# Patient Record
Sex: Male | Born: 1980 | Race: Black or African American | Hispanic: No | Marital: Single | Smoking: Current every day smoker
Health system: Southern US, Community
[De-identification: ages and names within clinical notes are randomized; demographics above are authoritative.]

## PROBLEM LIST (undated history)

## (undated) HISTORY — PX: ANKLE SURGERY: SHX546

---

## 2016-06-13 ENCOUNTER — Emergency Department
Admission: EM | Admit: 2016-06-13 | Discharge: 2016-06-13 | Disposition: A | Discharge status: Home / Self Care | Payer: Self-pay | Attending: Emergency Medicine | Admitting: Emergency Medicine

## 2016-06-13 ENCOUNTER — Encounter: Payer: Self-pay | Admitting: Emergency Medicine

## 2016-06-13 DIAGNOSIS — F17200 Nicotine dependence, unspecified, uncomplicated: Secondary | ICD-10-CM | POA: Insufficient documentation

## 2016-06-13 DIAGNOSIS — Z5321 Procedure and treatment not carried out due to patient leaving prior to being seen by health care provider: Secondary | ICD-10-CM | POA: Insufficient documentation

## 2016-06-13 DIAGNOSIS — K0889 Other specified disorders of teeth and supporting structures: Secondary | ICD-10-CM | POA: Insufficient documentation

## 2016-06-13 NOTE — ED Triage Notes (Signed)
Pt presents to ED c/o worsening tooth pain x1 week . Pt has taken excedrin , orajel , salt and water gurgles without relief

## 2020-09-11 ENCOUNTER — Emergency Department: Payer: Self-pay

## 2020-09-11 ENCOUNTER — Emergency Department
Admission: EM | Admit: 2020-09-11 | Discharge: 2020-09-11 | Disposition: A | Discharge status: Home / Self Care | Payer: Self-pay | Attending: Emergency Medicine | Admitting: Emergency Medicine

## 2020-09-11 ENCOUNTER — Other Ambulatory Visit: Payer: Self-pay

## 2020-09-11 DIAGNOSIS — F17210 Nicotine dependence, cigarettes, uncomplicated: Secondary | ICD-10-CM | POA: Insufficient documentation

## 2020-09-11 DIAGNOSIS — M5416 Radiculopathy, lumbar region: Secondary | ICD-10-CM | POA: Insufficient documentation

## 2020-09-11 MED ORDER — PREDNISONE 10 MG (21) PO TBPK: ORAL_TABLET | ORAL | 0 refills | Status: AC

## 2020-09-11 MED ORDER — BACLOFEN 10 MG PO TABS: 10.0000 mg | ORAL_TABLET | Freq: Three times a day (TID) | ORAL | 0 refills | Status: AC

## 2020-09-11 NOTE — Discharge Instructions (Addendum)
Clarence Hunt, chiropractor, [address] [place], [place], [place] [address] Phone: [phone] Dr Rosita Kea is general orthopedics, can order MRI if needed  Take the medication as prescribed

## 2020-09-11 NOTE — ED Provider Notes (Signed)
[facility] Emergency Department Provider Note  ____________________________________________   Event Date/Time   First MD Initiated Contact with Patient 09/11/20 [phone]     (approximate)  I have reviewed the triage vital signs and the nursing notes.   HISTORY  Chief Complaint Back Pain    HPI Clarence Hunt is a 40 y.o. male presents emergency department complaining of low back pain for approximately 6 weeks.  Patient states he was taken over-the-counter ibuprofen/Tylenol without any relief.  Patient works for a [employer] heavy objects on a regular basis.  States the pain is located in the lower back and radiates into the back of the thigh.  Denies numbness, tingling, loss of bowel or bladder control  History reviewed. No pertinent past medical history.  There are no problems to display for this patient.   Past Surgical History:  Procedure Laterality Date   ANKLE SURGERY      Prior to Admission medications   Medication Sig Start Date End Date Taking? Authorizing Provider  baclofen (LIORESAL) 10 MG tablet Take 1 tablet (10 mg total) by mouth 3 (three) times daily for 7 days. 09/11/20 09/18/20 Yes Beaulah Romanek, Roselyn Bering, PA-C  predniSONE (STERAPRED UNI-PAK 21 TAB) 10 MG (21) TBPK tablet Take 6 pills on day one then decrease by 1 pill each day 09/11/20  Yes Faythe Ghee, PA-C    Allergies Patient has no known allergies.  No family history on file.  Social History Social History   Tobacco Use   Smoking status: Every Day    Packs/day: 1.00    Types: Cigarettes  Substance Use Topics   Alcohol use: Yes    Review of Systems  Constitutional: No fever/chills Eyes: No visual changes. ENT: No sore throat. Respiratory: Denies cough Cardiovascular: Denies chest pain Gastrointestinal: Denies abdominal pain Genitourinary: Negative for dysuria. Musculoskeletal: Positive for back pain. Skin: Negative for rash. Psychiatric: no mood  changes,     ____________________________________________   PHYSICAL EXAM:  VITAL SIGNS: ED Triage Vitals  Enc Vitals Group     BP 09/11/20 0628 (!) 183/105     Pulse Rate 09/11/20 0628 62     Resp 09/11/20 0628 18     Temp 09/11/20 0628 98.3 F (36.8 C)     Temp Source 09/11/20 0628 Oral     SpO2 09/11/20 0628 100 %     Weight 09/11/20 0630 230 lb (104.3 kg)     Height 09/11/20 0630 5\' 11"  (1.803 m)     Head Circumference --      Peak Flow --      Pain Score 09/11/20 0633 1     Pain Loc --      Pain Edu? --      Excl. in GC? --     Constitutional: Alert and oriented. Well appearing and in no acute distress. Eyes: Conjunctivae are normal.  Head: Atraumatic. Nose: No congestion/rhinnorhea. Mouth/Throat: Mucous membranes are moist.   Neck:  supple no lymphadenopathy noted Cardiovascular: Normal rate, regular rhythm. Heart sounds are normal Respiratory: Normal respiratory effort.  No retractions, lungs c t a  GU: deferred Musculoskeletal: FROM all extremities, warm and well perfused, lower back is tender along the paravertebral muscles and into the SI joint, pain is reproduced with deep palpation along the right buttock, patient has decreased range of motion with leaning forwards, neurovascular is intact, he is able to walk on his toes and reveals Neurologic:  Normal speech and language.  Skin:  Skin is warm, dry and intact. No rash noted. Psychiatric: Mood and affect are normal. Speech and behavior are normal.  ____________________________________________   LABS (all labs ordered are listed, but only abnormal results are displayed)  Labs Reviewed - No data to display ____________________________________________   ____________________________________________  RADIOLOGY  X-ray lumbar spine  ____________________________________________   PROCEDURES  Procedure(s) performed: No  Procedures    ____________________________________________   INITIAL  IMPRESSION / ASSESSMENT AND PLAN / ED COURSE  Pertinent labs & imaging results that were available during my care of the patient were reviewed by me and considered in my medical decision making (see chart for details).   The patient is 40 year old male presents with low back pains.  See HPI.  Physical exam shows patient appears stable  X-ray lumbar spine  Lumbar spine reviewed by me and confirmed by radiology to have degenerative changes  Explained the findings to the patient, placed him on sterapred and baclofen.  Follow up with a chiropractor or orthopedics if not improving in 1 week, given back exercises and proper lifting instructions, discharged in stable condition     Clarence Hunt was evaluated in Emergency Department on 09/11/2020 for the symptoms described in the history of present illness. He was evaluated in the context of the global COVID-19 pandemic, which necessitated consideration that the patient might be at risk for infection with the SARS-CoV-2 virus that causes COVID-19. Institutional protocols and algorithms that pertain to the evaluation of patients at risk for COVID-19 are in a state of rapid change based on information released by regulatory bodies including the CDC and federal and state organizations. These policies and algorithms were followed during the patient's care in the ED.    As part of my medical decision making, I reviewed the following data within the electronic MEDICAL RECORD NUMBER Nursing notes reviewed and incorporated, Old chart reviewed, Radiograph reviewed , Notes from prior ED visits, and [place] Controlled Substance Database  ____________________________________________   FINAL CLINICAL IMPRESSION(S) / ED DIAGNOSES  Final diagnoses:  Lumbar radiculopathy      NEW MEDICATIONS STARTED DURING THIS VISIT:  Discharge Medication List as of 09/11/2020  8:39 AM     START taking these medications   Details  baclofen (LIORESAL) 10 MG tablet Take 1 tablet  (10 mg total) by mouth 3 (three) times daily for 7 days., Starting Fri 09/11/2020, Until Fri 09/18/2020, Normal    predniSONE (STERAPRED UNI-PAK 21 TAB) 10 MG (21) TBPK tablet Take 6 pills on day one then decrease by 1 pill each day, Normal         Note:  This document was prepared using Dragon voice recognition software and may include unintentional dictation errors.    Faythe Ghee, PA-C 09/11/20 [id number]    Georga Hacking, MD 09/11/20 [phone]

## 2020-09-11 NOTE — ED Notes (Signed)
See triage note  Presents with lower back pain for about 6-7 weeks  Unsure of injury  States he does a lot of heavy lifting  Ambulates well to treatment room

## 2020-09-11 NOTE — ED Triage Notes (Signed)
Reports right lower back pain radiating down into back of right leg and stopping at knee X 6-7 weeks. Heavy lifting at work. Hurts same amount all of the time.

## 2022-08-11 IMAGING — CR DG LUMBAR SPINE 2-3V
1 series · 3 of 3 positions shown · non-contrast
Comparison: None.

CLINICAL DATA: Lower back and right leg pain for several weeks.
Heavy lifting at work.

EXAM:
LUMBAR SPINE - 2-3 VIEW

[Series 1: dg lumbar spine 2-3 views · 0.14mm/px · 3 of 3 slices shown]
[im 1/3]
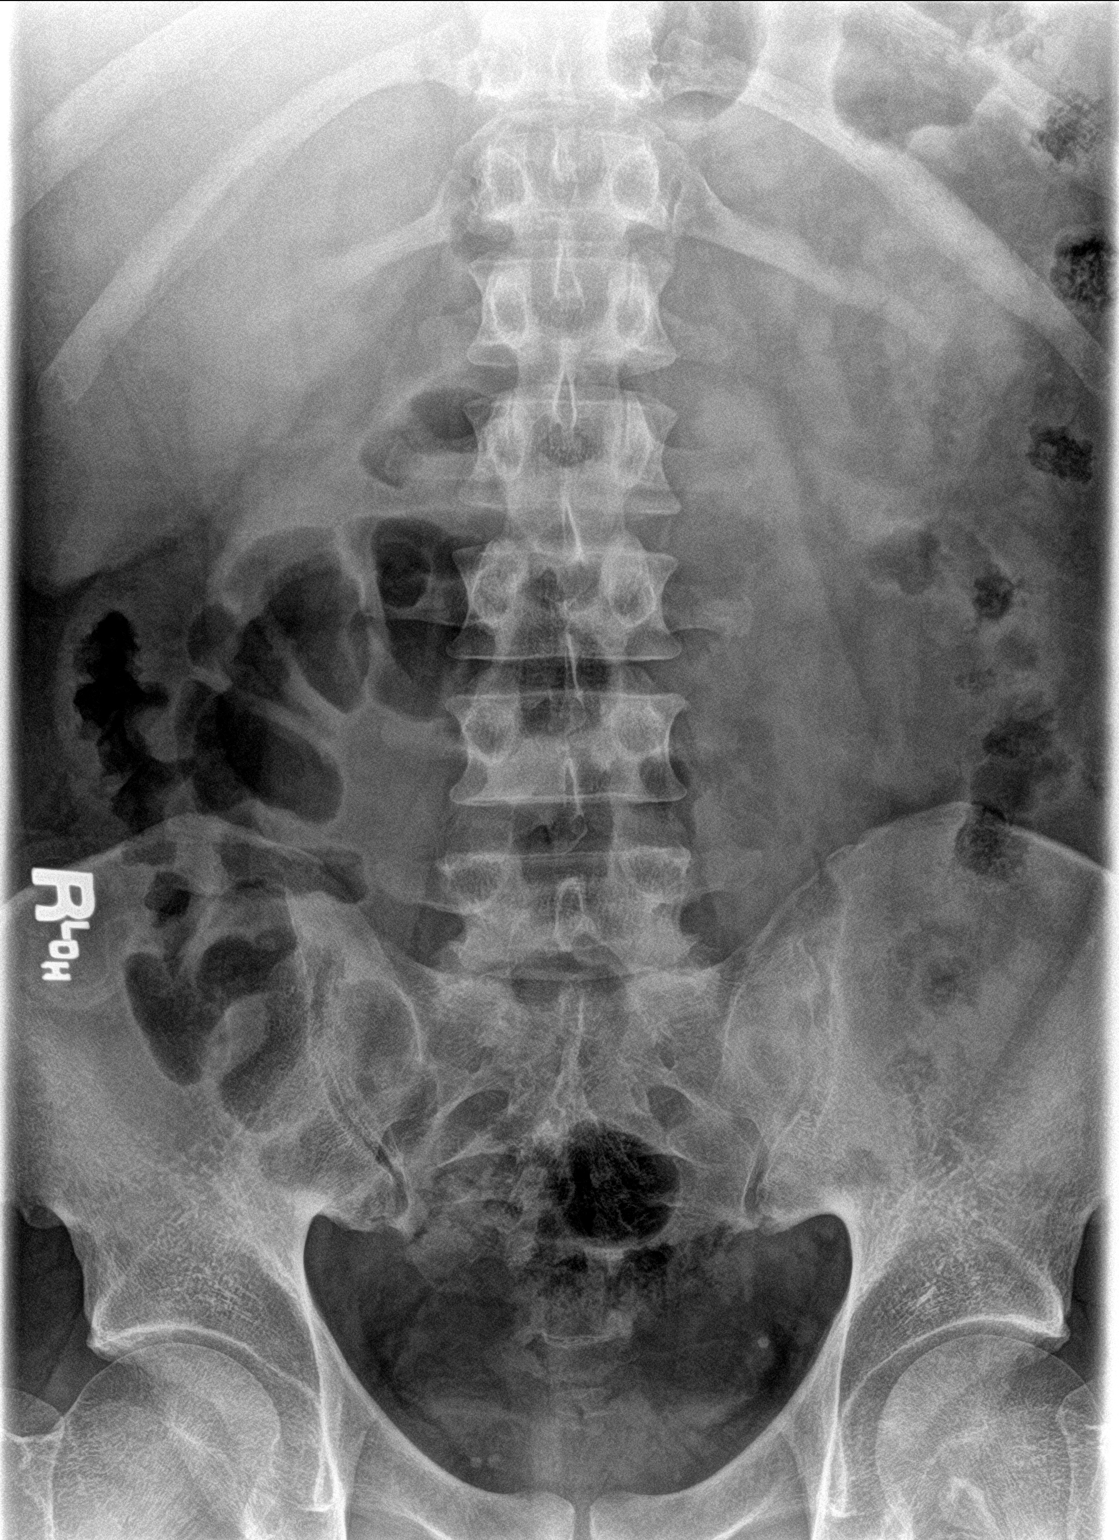
[im 2/3]
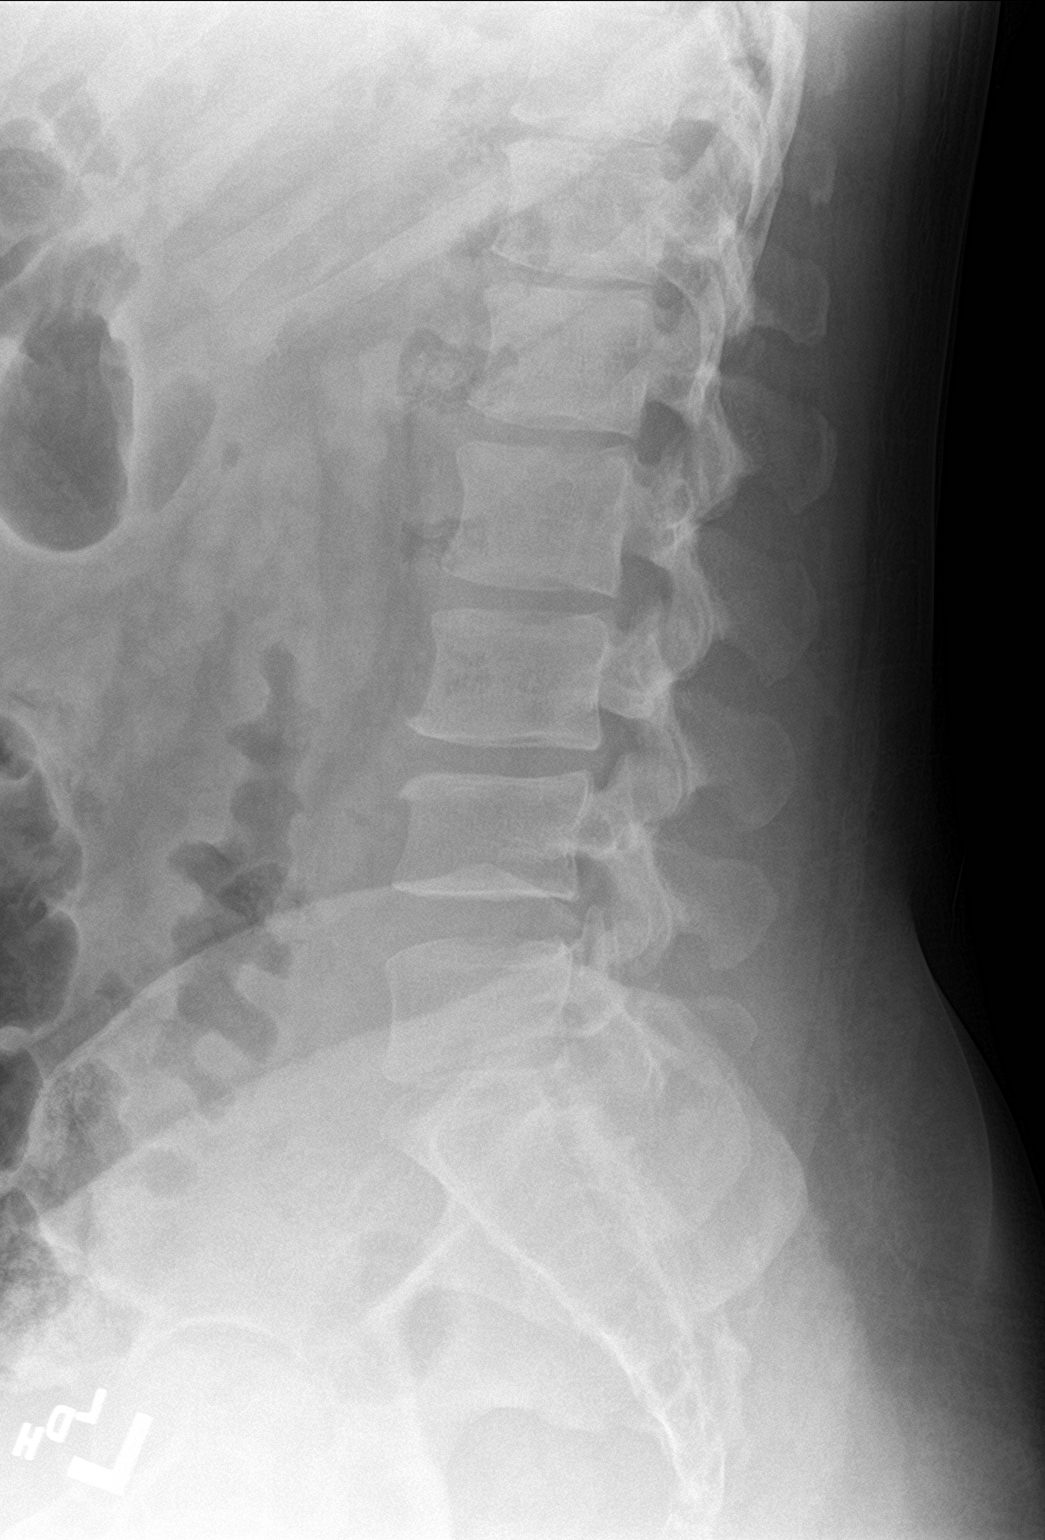
[im 3/3]
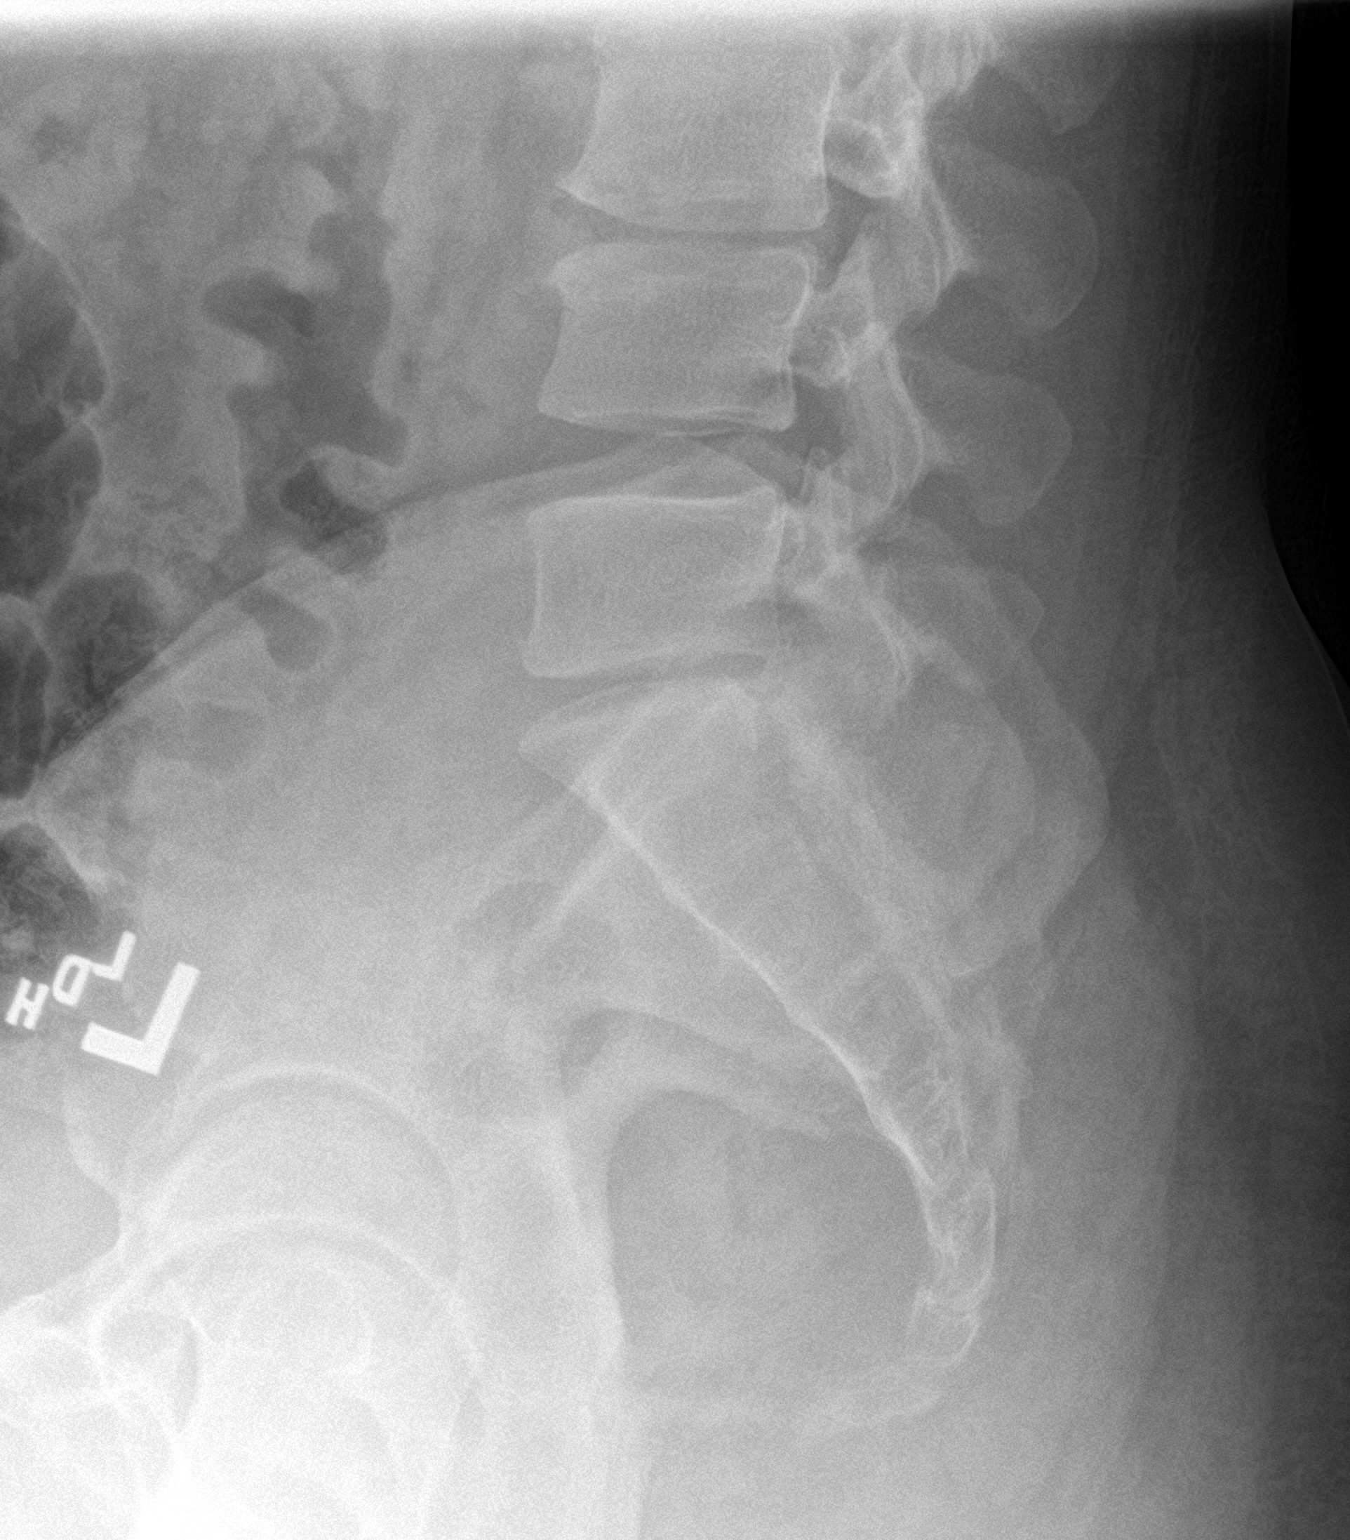

[3 of 3 positions shown; findings below may reference images not displayed]

FINDINGS: No fracture or spondylolisthesis is noted. Minimal degenerative disc
disease is noted at L3-4 with anterior osteophyte formation.
IMPRESSION: Minimal degenerative disc disease is noted at L3-4. No acute
abnormality is noted.
